# Patient Record
Sex: Male | Born: 1990 | Race: White | Hispanic: No | Marital: Married | Smoking: Never smoker
Health system: Southern US, Community
[De-identification: ages and names within clinical notes are randomized; demographics above are authoritative.]

## PROBLEM LIST (undated history)

## (undated) HISTORY — PX: ORTHOPEDIC SURGERY: SHX850

---
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
## 2018-03-10 ENCOUNTER — Ambulatory Visit (INDEPENDENT_AMBULATORY_CARE_PROVIDER_SITE_OTHER): Payer: BLUE CROSS/BLUE SHIELD | Admitting: Otolaryngology

## 2018-03-10 DIAGNOSIS — R070 Pain in throat: Secondary | ICD-10-CM | POA: Diagnosis not present

## 2020-07-04 ENCOUNTER — Encounter (HOSPITAL_COMMUNITY): Payer: Self-pay | Admitting: *Deleted

## 2020-07-04 ENCOUNTER — Emergency Department (HOSPITAL_COMMUNITY)
Admission: EM | Admit: 2020-07-04 | Discharge: 2020-07-04 | Disposition: A | Discharge status: Home / Self Care | Payer: BC Managed Care – PPO | Attending: Emergency Medicine | Admitting: Emergency Medicine

## 2020-07-04 DIAGNOSIS — R112 Nausea with vomiting, unspecified: Secondary | ICD-10-CM

## 2020-07-04 DIAGNOSIS — B349 Viral infection, unspecified: Secondary | ICD-10-CM | POA: Insufficient documentation

## 2020-07-04 DIAGNOSIS — Z20822 Contact with and (suspected) exposure to covid-19: Secondary | ICD-10-CM | POA: Diagnosis not present

## 2020-07-04 DIAGNOSIS — E860 Dehydration: Secondary | ICD-10-CM | POA: Diagnosis not present

## 2020-07-04 DIAGNOSIS — R0902 Hypoxemia: Secondary | ICD-10-CM | POA: Diagnosis not present

## 2020-07-04 DIAGNOSIS — R000 Tachycardia, unspecified: Secondary | ICD-10-CM | POA: Diagnosis not present

## 2020-07-04 DIAGNOSIS — I959 Hypotension, unspecified: Secondary | ICD-10-CM | POA: Diagnosis not present

## 2020-07-04 LAB — COMPREHENSIVE METABOLIC PANEL WITH GFR
ALT: 26 U/L (ref 0–44)
AST: 22 U/L (ref 15–41)
Albumin: 4.4 g/dL (ref 3.5–5.0)
Alkaline Phosphatase: 36 U/L — ABNORMAL LOW (ref 38–126)
Anion gap: 12 (ref 5–15)
BUN: 19 mg/dL (ref 6–20)
CO2: 22 mmol/L (ref 22–32)
Calcium: 8.8 mg/dL — ABNORMAL LOW (ref 8.9–10.3)
Chloride: 101 mmol/L (ref 98–111)
Creatinine, Ser: 0.92 mg/dL (ref 0.61–1.24)
GFR, Estimated: >60 mL/min
Glucose, Bld: 108 mg/dL — ABNORMAL HIGH (ref 70–99)
Potassium: 3.6 mmol/L (ref 3.5–5.1)
Sodium: 135 mmol/L (ref 135–145)
Total Bilirubin: 0.9 mg/dL (ref 0.3–1.2)
Total Protein: 7.8 g/dL (ref 6.5–8.1)

## 2020-07-04 LAB — CBC WITH DIFFERENTIAL/PLATELET
Abs Immature Granulocytes: 0.04 10*3/uL (ref 0.00–0.07)
Basophils Absolute: 0 10*3/uL (ref 0.0–0.1)
Basophils Relative: 0 %
Eosinophils Absolute: 0 10*3/uL (ref 0.0–0.5)
Eosinophils Relative: 0 %
HCT: 47.7 % (ref 39.0–52.0)
Hemoglobin: 16.1 g/dL (ref 13.0–17.0)
Immature Granulocytes: 0 %
Lymphocytes Relative: 6 %
Lymphs Abs: 0.8 10*3/uL (ref 0.7–4.0)
MCH: 29.8 pg (ref 26.0–34.0)
MCHC: 33.8 g/dL (ref 30.0–36.0)
MCV: 88.3 fL (ref 80.0–100.0)
Monocytes Absolute: 0.7 10*3/uL (ref 0.1–1.0)
Monocytes Relative: 6 %
Neutro Abs: 11.2 10*3/uL — ABNORMAL HIGH (ref 1.7–7.7)
Neutrophils Relative %: 88 %
Platelets: 281 10*3/uL (ref 150–400)
RBC: 5.4 MIL/uL (ref 4.22–5.81)
RDW: 12.8 % (ref 11.5–15.5)
WBC: 12.8 10*3/uL — ABNORMAL HIGH (ref 4.0–10.5)
nRBC: 0 % (ref 0.0–0.2)

## 2020-07-04 LAB — RESP PANEL BY RT-PCR (RSV, FLU A&B, COVID)  RVPGX2
Influenza A by PCR: NEGATIVE
Influenza B by PCR: NEGATIVE
Resp Syncytial Virus by PCR: NEGATIVE
SARS Coronavirus 2 by RT PCR: NEGATIVE

## 2020-07-04 LAB — LIPASE, BLOOD: Lipase: 23 U/L (ref 11–51)

## 2020-07-04 MED ORDER — SODIUM CHLORIDE 0.9 % IV BOLUS
500.0000 mL | Freq: Once | INTRAVENOUS | Status: AC
  Administered 2020-07-04: 500 mL via INTRAVENOUS

## 2020-07-04 MED ORDER — ONDANSETRON HCL 4 MG/2ML IJ SOLN
4.0000 mg | Freq: Once | INTRAMUSCULAR | Status: AC
  Administered 2020-07-04: 4 mg via INTRAVENOUS
  Filled 2020-07-04: qty 2

## 2020-07-04 MED ORDER — SODIUM CHLORIDE 0.9 % IV BOLUS
1000.0000 mL | Freq: Once | INTRAVENOUS | Status: AC
  Administered 2020-07-04: 1000 mL via INTRAVENOUS

## 2020-07-04 MED ORDER — PROCHLORPERAZINE EDISYLATE 10 MG/2ML IJ SOLN
10.0000 mg | Freq: Once | INTRAMUSCULAR | Status: AC
  Administered 2020-07-04: 10 mg via INTRAVENOUS
  Filled 2020-07-04: qty 2

## 2020-07-04 MED ORDER — ONDANSETRON 4 MG PO TBDP: 4.0000 mg | ORAL_TABLET | Freq: Three times a day (TID) | ORAL | 0 refills | Status: AC | PRN

## 2020-07-04 NOTE — Discharge Instructions (Addendum)
Please use Zofran as needed for nausea and vomiting.  Please follow-up with your primary care provider regarding today's encounter and for ongoing evaluation and management.  Drink plenty of fluids to replace those lost with nausea and diarrhea.  I recommend Powerade and Gatorade.  You also need to continue eating regular meals.  Please check your temperature regularly and take Tylenol as needed for fever control.  Return to the ED or seek immediate medical attention should you experience any new or worsening symptoms.

## 2020-07-04 NOTE — ED Provider Notes (Signed)
[facility] EMERGENCY DEPARTMENT Provider Note   CSN: [id number] Arrival date & time: 07/04/20  1714     History Chief Complaint  Patient presents with  . Vomiting    Randall Cunningham is a 29 y.o. male with no past medical history presents the ED with complaints of intractable nausea and vomiting since 3 AM this morning.  Patient is also endorsing 6 episodes of nonbloody diarrhea.  He lives at home with his wife and 3 children.  He states that his children have also been ill for approximately 3 days with similar symptoms.  He denies any fevers, chills, hematemesis, chest pain, shortness of breath, abdominal pain, or urinary symptoms.  He states that he has reduced urine output given suspected dehydration in the context of his symptoms.  He informed EMS that he suspects he has had roughly 40 episodes of emesis.  He conducted a virtual visit and had been prescribed Zofran 4 mg ODT, however it failed to alleviate his symptoms.  He denies any recent alcohol use and adamantly denies having ever smoked marijuana.  HPI     History reviewed. No pertinent past medical history.  There are no problems to display for this patient.   Past Surgical History:  Procedure Laterality Date  . ORTHOPEDIC SURGERY         No family history on file.  Social History   Tobacco Use  . Smoking status: Never Smoker  . Smokeless tobacco: Never Used  Substance Use Topics  . Alcohol use: Not Currently  . Drug use: Not Currently    Home Medications Prior to Admission medications   Medication Sig Start Date End Date Taking? Authorizing Provider  ondansetron (ZOFRAN ODT) 4 MG disintegrating tablet Take 1 tablet (4 mg total) by mouth every 8 (eight) hours as needed for nausea or vomiting. 07/04/20   Lorelee New, PA-C    Allergies    Patient has no allergy information on record.  Review of Systems   Review of Systems  All other systems reviewed and are negative.   Physical Exam Updated  Vital Signs BP (!) 140/96   Pulse (!) 104   Temp 98 F (36.7 C) (Oral)   Resp 20   SpO2 99%   Physical Exam Vitals and nursing note reviewed. Exam conducted with a chaperone present.  Constitutional:      Appearance: He is ill-appearing.  HENT:     Head: Normocephalic and atraumatic.  Eyes:     General: No scleral icterus.    Conjunctiva/sclera: Conjunctivae normal.  Cardiovascular:     Rate and Rhythm: Regular rhythm. Tachycardia present.     Pulses: Normal pulses.  Pulmonary:     Effort: Pulmonary effort is normal. No respiratory distress.  Abdominal:     General: Abdomen is flat. There is no distension.     Palpations: Abdomen is soft. There is no mass.     Tenderness: There is no abdominal tenderness. There is no guarding.     Comments: No tenderness, guarding, or overlying skin changes.  Soft, nondistended.  Musculoskeletal:        General: Normal range of motion.  Skin:    General: Skin is dry.     Capillary Refill: Capillary refill takes less than 2 seconds.  Neurological:     Mental Status: He is alert and oriented to person, place, and time.     GCS: GCS eye subscore is 4. GCS verbal subscore is 5. GCS motor subscore is 6.  Psychiatric:        Mood and Affect: Mood normal.        Behavior: Behavior normal.        Thought Content: Thought content normal.     ED Results / Procedures / Treatments   Labs (all labs ordered are listed, but only abnormal results are displayed) Labs Reviewed  CBC WITH DIFFERENTIAL/PLATELET - Abnormal; Notable for the following components:      Result Value   WBC 12.8 (*)    Neutro Abs 11.2 (*)    All other components within normal limits  COMPREHENSIVE METABOLIC PANEL - Abnormal; Notable for the following components:   Glucose, Bld 108 (*)    Calcium 8.8 (*)    Alkaline Phosphatase 36 (*)    All other components within normal limits  RESP PANEL BY RT-PCR (RSV, FLU A&B, COVID)  RVPGX2  LIPASE, BLOOD     EKG None  Radiology No results found.  Procedures Procedures (including critical care time)  Medications Ordered in ED Medications  sodium chloride 0.9 % bolus 1,000 mL (0 mLs Intravenous Stopped 07/04/20 2006)  ondansetron (ZOFRAN) injection 4 mg (4 mg Intravenous Given 07/04/20 1858)  sodium chloride 0.9 % bolus 500 mL (0 mLs Intravenous Stopped 07/04/20 2054)  prochlorperazine (COMPAZINE) injection 10 mg (10 mg Intravenous Given 07/04/20 2009)    ED Course  I have reviewed the triage vital signs and the nursing notes.  Pertinent labs & imaging results that were available during my care of the patient were reviewed by me and considered in my medical decision making (see chart for details).    MDM Rules/Calculators/A&P                          Patient suspects that he picked up a viral illness, perhaps from his children.  There are multiple people in the household who are sick with similar symptoms.  However, his symptoms have been intolerable and he is concerned for dehydration given the frequency and quantity of his episodes of emesis.  He has not been able to tolerate anything by mouth since symptom onset.  He states that he felt perfectly fine prior to his symptoms.  Will provide 1 L IV NS bolus and Zofran 4 mg IV here in the ED and reassess.  Basic laboratory work-up will be obtained.  He is otherwise healthy 29 year old male.  Labs Respiratory panel by PCR: Negative. CBC with differential: Mild leukocytosis 12.8.  Otherwise unremarkable. Lipase: Within normal notes. CMP: No significant electrolyte derangement.  No renal impairment.  Patient's tachycardia improved with fluids here in the ED.  He states that he feels entirely well after administration of 1.5 L NS and combination of Zofran and Compazine for his nausea symptoms.  He feels prepared for discharge and is requesting papers.  Physical exam is reassuring and without any abdominal tenderness.  Do not feel as  though imaging is warranted.  Patient to follow-up with his primary care provider for ongoing evaluation and management.  Zofran ODT given for persistent nausea symptoms.  ED return precautions discussed.  Patient voices understanding and is agreeable to the plan.   Final Clinical Impression(s) / ED Diagnoses Final diagnoses:  Viral illness  Non-intractable vomiting with nausea, unspecified vomiting type    Rx / DC Orders ED Discharge Orders         Ordered    ondansetron (ZOFRAN ODT) 4 MG disintegrating tablet  Every 8 hours PRN  07/04/20 2105           Lorelee New, PA-C 07/04/20 2107    Terald Sleeper, MD 07/05/20 [phone]

## 2020-07-04 NOTE — ED Notes (Signed)

## 2020-07-04 NOTE — ED Triage Notes (Signed)
States he had a syncopal episode prior to arrival

## 2020-07-04 NOTE — ED Triage Notes (Signed)
Vomiting since 0300

## 2020-07-04 NOTE — ED Notes (Signed)
Pt rolled and changed into a clean bed sheet and provided a warm blanket and cup of ice per request. At this time, patient appears asleep in bed with no signs of distress noted. Bed is locked in the lowest position, side rails x2, call bell within reach. Will continue to monitor at this time.

## 2020-07-04 NOTE — ED Notes (Addendum)
PA made aware that patient is requesting to be discharged at this time. Pt tolerating ice chips without distress.   8:58 PM Dr. Renaye Rakers at bedside at this time. Pt provided with a ginger ale and crackers, tolerating ginger ale at this time.
